# Patient Record
Sex: Female | Born: 1945 | Race: White | Hispanic: No | Smoking: Former smoker
Health system: Southern US, Community
[De-identification: ages and names within clinical notes are randomized; demographics above are authoritative.]

## PROBLEM LIST (undated history)

## (undated) DIAGNOSIS — M539 Dorsopathy, unspecified: Secondary | ICD-10-CM

## (undated) DIAGNOSIS — I719 Aortic aneurysm of unspecified site, without rupture: Secondary | ICD-10-CM

## (undated) DIAGNOSIS — D649 Anemia, unspecified: Secondary | ICD-10-CM

## (undated) DIAGNOSIS — G4730 Sleep apnea, unspecified: Secondary | ICD-10-CM

## (undated) DIAGNOSIS — M797 Fibromyalgia: Secondary | ICD-10-CM

## (undated) DIAGNOSIS — M069 Rheumatoid arthritis, unspecified: Secondary | ICD-10-CM

## (undated) HISTORY — PX: HIP SURGERY: SHX245

## (undated) HISTORY — PX: BACK SURGERY: SHX140

## (undated) HISTORY — PX: APPENDECTOMY: SHX54

## (undated) HISTORY — PX: JOINT REPLACEMENT: SHX530

## (undated) HISTORY — PX: ABDOMINAL HYSTERECTOMY: SHX81

## (undated) HISTORY — PX: ROTATOR CUFF REPAIR: SHX139

## (undated) HISTORY — PX: KNEE SURGERY: SHX244

---

## 1998-08-24 ENCOUNTER — Ambulatory Visit (HOSPITAL_COMMUNITY): Admission: RE | Admit: 1998-08-24 | Discharge: 1998-08-24 | Payer: Self-pay | Admitting: Orthopaedic Surgery

## 2002-04-08 ENCOUNTER — Encounter: Payer: Self-pay | Admitting: Internal Medicine

## 2002-04-08 ENCOUNTER — Encounter: Admission: RE | Admit: 2002-04-08 | Discharge: 2002-04-08 | Payer: Self-pay | Admitting: Internal Medicine

## 2002-11-16 ENCOUNTER — Encounter: Payer: Self-pay | Admitting: Specialist

## 2002-11-16 ENCOUNTER — Encounter: Admission: RE | Admit: 2002-11-16 | Discharge: 2002-11-16 | Payer: Self-pay | Admitting: Specialist

## 2002-11-21 ENCOUNTER — Encounter: Admission: RE | Admit: 2002-11-21 | Discharge: 2002-11-21 | Payer: Self-pay | Admitting: Specialist

## 2002-11-21 ENCOUNTER — Encounter: Payer: Self-pay | Admitting: Specialist

## 2016-04-23 ENCOUNTER — Other Ambulatory Visit: Payer: Self-pay | Admitting: Rheumatology

## 2016-04-23 ENCOUNTER — Ambulatory Visit
Admission: RE | Admit: 2016-04-23 | Discharge: 2016-04-23 | Disposition: A | Discharge status: Home / Self Care | Payer: Medicare Other | Source: Ambulatory Visit | Attending: Rheumatology | Admitting: Rheumatology

## 2016-04-23 DIAGNOSIS — M545 Low back pain: Secondary | ICD-10-CM

## 2016-04-23 DIAGNOSIS — M549 Dorsalgia, unspecified: Secondary | ICD-10-CM

## 2016-04-25 ENCOUNTER — Other Ambulatory Visit: Payer: Self-pay | Admitting: Rheumatology

## 2016-04-25 DIAGNOSIS — M546 Pain in thoracic spine: Secondary | ICD-10-CM

## 2016-04-26 ENCOUNTER — Ambulatory Visit
Admission: RE | Admit: 2016-04-26 | Discharge: 2016-04-26 | Disposition: A | Discharge status: Home / Self Care | Payer: Medicare Other | Source: Ambulatory Visit | Attending: Rheumatology | Admitting: Rheumatology

## 2016-04-26 DIAGNOSIS — M546 Pain in thoracic spine: Secondary | ICD-10-CM

## 2016-05-06 ENCOUNTER — Other Ambulatory Visit (HOSPITAL_COMMUNITY): Payer: Self-pay | Admitting: Interventional Radiology

## 2016-05-06 DIAGNOSIS — IMO0001 Reserved for inherently not codable concepts without codable children: Secondary | ICD-10-CM

## 2016-05-06 DIAGNOSIS — M4850XS Collapsed vertebra, not elsewhere classified, site unspecified, sequela of fracture: Principal | ICD-10-CM

## 2016-05-15 ENCOUNTER — Ambulatory Visit (HOSPITAL_COMMUNITY)
Admission: RE | Admit: 2016-05-15 | Discharge: 2016-05-15 | Disposition: A | Discharge status: Home / Self Care | Payer: Medicare Other | Source: Ambulatory Visit | Attending: Interventional Radiology | Admitting: Interventional Radiology

## 2016-05-15 DIAGNOSIS — IMO0001 Reserved for inherently not codable concepts without codable children: Secondary | ICD-10-CM

## 2016-05-15 DIAGNOSIS — M4850XS Collapsed vertebra, not elsewhere classified, site unspecified, sequela of fracture: Principal | ICD-10-CM

## 2016-05-15 HISTORY — PX: IR GENERIC HISTORICAL: IMG1180011

## 2016-05-16 ENCOUNTER — Encounter (HOSPITAL_COMMUNITY): Payer: Self-pay | Admitting: Interventional Radiology

## 2016-05-20 ENCOUNTER — Telehealth (HOSPITAL_COMMUNITY): Payer: Self-pay

## 2016-05-20 NOTE — Telephone Encounter (Signed)
Called to schedule kp/vp, left message for pt to return call. AW 

## 2016-05-22 ENCOUNTER — Other Ambulatory Visit (HOSPITAL_COMMUNITY): Payer: Self-pay | Admitting: Interventional Radiology

## 2016-05-22 ENCOUNTER — Telehealth (HOSPITAL_COMMUNITY): Payer: Self-pay

## 2016-05-22 DIAGNOSIS — IMO0001 Reserved for inherently not codable concepts without codable children: Secondary | ICD-10-CM

## 2016-05-22 DIAGNOSIS — M4850XS Collapsed vertebra, not elsewhere classified, site unspecified, sequela of fracture: Principal | ICD-10-CM

## 2016-05-22 NOTE — Telephone Encounter (Signed)
Called to schedule procedure, left message for pt to return call. AW 

## 2016-06-03 ENCOUNTER — Other Ambulatory Visit: Payer: Self-pay | Admitting: Radiology

## 2016-06-04 ENCOUNTER — Other Ambulatory Visit: Payer: Self-pay | Admitting: General Surgery

## 2016-06-05 ENCOUNTER — Telehealth (HOSPITAL_COMMUNITY): Payer: Self-pay

## 2016-06-05 ENCOUNTER — Ambulatory Visit (HOSPITAL_COMMUNITY)
Admission: RE | Admit: 2016-06-05 | Discharge: 2016-06-05 | Disposition: A | Discharge status: Home / Self Care | Payer: Medicare Other | Source: Ambulatory Visit | Attending: Interventional Radiology | Admitting: Interventional Radiology

## 2016-06-05 ENCOUNTER — Other Ambulatory Visit (HOSPITAL_COMMUNITY): Payer: Self-pay | Admitting: Interventional Radiology

## 2016-06-05 ENCOUNTER — Encounter (HOSPITAL_COMMUNITY): Payer: Self-pay

## 2016-06-05 DIAGNOSIS — I719 Aortic aneurysm of unspecified site, without rupture: Secondary | ICD-10-CM | POA: Diagnosis not present

## 2016-06-05 DIAGNOSIS — M797 Fibromyalgia: Secondary | ICD-10-CM | POA: Insufficient documentation

## 2016-06-05 DIAGNOSIS — M069 Rheumatoid arthritis, unspecified: Secondary | ICD-10-CM | POA: Diagnosis not present

## 2016-06-05 DIAGNOSIS — M4850XS Collapsed vertebra, not elsewhere classified, site unspecified, sequela of fracture: Secondary | ICD-10-CM

## 2016-06-05 DIAGNOSIS — M810 Age-related osteoporosis without current pathological fracture: Secondary | ICD-10-CM | POA: Insufficient documentation

## 2016-06-05 DIAGNOSIS — M4854XA Collapsed vertebra, not elsewhere classified, thoracic region, initial encounter for fracture: Secondary | ICD-10-CM | POA: Insufficient documentation

## 2016-06-05 DIAGNOSIS — N644 Mastodynia: Secondary | ICD-10-CM | POA: Diagnosis not present

## 2016-06-05 DIAGNOSIS — G4730 Sleep apnea, unspecified: Secondary | ICD-10-CM | POA: Diagnosis not present

## 2016-06-05 DIAGNOSIS — D649 Anemia, unspecified: Secondary | ICD-10-CM | POA: Diagnosis not present

## 2016-06-05 DIAGNOSIS — IMO0001 Reserved for inherently not codable concepts without codable children: Secondary | ICD-10-CM

## 2016-06-05 DIAGNOSIS — R52 Pain, unspecified: Secondary | ICD-10-CM

## 2016-06-05 HISTORY — DX: Aortic aneurysm of unspecified site, without rupture: I71.9

## 2016-06-05 HISTORY — DX: Rheumatoid arthritis, unspecified: M06.9

## 2016-06-05 HISTORY — DX: Anemia, unspecified: D64.9

## 2016-06-05 HISTORY — DX: Fibromyalgia: M79.7

## 2016-06-05 HISTORY — DX: Dorsopathy, unspecified: M53.9

## 2016-06-05 HISTORY — DX: Sleep apnea, unspecified: G47.30

## 2016-06-05 HISTORY — PX: IR GENERIC HISTORICAL: IMG1180011

## 2016-06-05 LAB — PROTIME-INR
INR: 1.02
PROTHROMBIN TIME: 13.4 s (ref 11.4–15.2)

## 2016-06-05 LAB — BASIC METABOLIC PANEL
ANION GAP: 10 (ref 5–15)
BUN: 11 mg/dL (ref 6–20)
CALCIUM: 9.2 mg/dL (ref 8.9–10.3)
CO2: 28 mmol/L (ref 22–32)
Chloride: 100 mmol/L — ABNORMAL LOW (ref 101–111)
Creatinine, Ser: 0.71 mg/dL (ref 0.44–1.00)
GFR calc Af Amer: >60 mL/min (ref >60)
GFR calc non Af Amer: >60 mL/min (ref >60)
Glucose, Bld: 170 mg/dL — ABNORMAL HIGH (ref 65–99)
POTASSIUM: 3.8 mmol/L (ref 3.5–5.1)
SODIUM: 138 mmol/L (ref 135–145)

## 2016-06-05 LAB — CBC
HCT: 42.7 % (ref 36.0–46.0)
HEMOGLOBIN: 13.2 g/dL (ref 12.0–15.0)
MCH: 28.8 pg (ref 26.0–34.0)
MCHC: 30.9 g/dL (ref 30.0–36.0)
MCV: 93.2 fL (ref 78.0–100.0)
Platelets: 236 10*3/uL (ref 150–400)
RBC: 4.58 MIL/uL (ref 3.87–5.11)
RDW: 15.8 % — ABNORMAL HIGH (ref 11.5–15.5)
WBC: 11 10*3/uL — ABNORMAL HIGH (ref 4.0–10.5)

## 2016-06-05 LAB — APTT: APTT: 27 s (ref 24–36)

## 2016-06-05 MED ORDER — MIDAZOLAM HCL 2 MG/2ML IJ SOLN
INTRAMUSCULAR | Status: AC
  Filled 2016-06-05: qty 2

## 2016-06-05 MED ORDER — HYDROMORPHONE HCL 1 MG/ML IJ SOLN
INTRAMUSCULAR | Status: AC
  Filled 2016-06-05: qty 1

## 2016-06-05 MED ORDER — IOPAMIDOL (ISOVUE-300) INJECTION 61%
INTRAVENOUS | Status: AC
  Administered 2016-06-05: 3 mL
  Filled 2016-06-05: qty 50

## 2016-06-05 MED ORDER — BUPIVACAINE HCL (PF) 0.25 % IJ SOLN
INTRAMUSCULAR | Status: AC
  Filled 2016-06-05: qty 30

## 2016-06-05 MED ORDER — SODIUM CHLORIDE 0.9 % IV SOLN: INTRAVENOUS | Status: DC

## 2016-06-05 MED ORDER — SODIUM CHLORIDE 0.9 % IV SOLN: INTRAVENOUS | Status: AC

## 2016-06-05 MED ORDER — FENTANYL CITRATE (PF) 100 MCG/2ML IJ SOLN
INTRAMUSCULAR | Status: AC | PRN
  Administered 2016-06-05: 25 ug via INTRAVENOUS
  Administered 2016-06-05 (×2): 50 ug via INTRAVENOUS

## 2016-06-05 MED ORDER — CEFAZOLIN SODIUM-DEXTROSE 2-4 GM/100ML-% IV SOLN
2.0000 g | INTRAVENOUS | Status: AC
  Administered 2016-06-05: 2 g via INTRAVENOUS

## 2016-06-05 MED ORDER — TOBRAMYCIN SULFATE 1.2 G IJ SOLR
INTRAMUSCULAR | Status: AC
  Filled 2016-06-05: qty 1.2

## 2016-06-05 MED ORDER — CEFAZOLIN SODIUM-DEXTROSE 2-4 GM/100ML-% IV SOLN
INTRAVENOUS | Status: AC
  Filled 2016-06-05: qty 100

## 2016-06-05 MED ORDER — FENTANYL CITRATE (PF) 100 MCG/2ML IJ SOLN
INTRAMUSCULAR | Status: AC
  Filled 2016-06-05: qty 2

## 2016-06-05 MED ORDER — TOBRAMYCIN SULFATE 1.2 G IJ SOLR
INTRAMUSCULAR | Status: AC | PRN
  Administered 2016-06-05: .01 g via TOPICAL

## 2016-06-05 MED ORDER — MIDAZOLAM HCL 2 MG/2ML IJ SOLN
INTRAMUSCULAR | Status: AC | PRN
  Administered 2016-06-05 (×2): 1 mg via INTRAVENOUS

## 2016-06-05 MED ORDER — HYDROMORPHONE HCL 1 MG/ML IJ SOLN
INTRAMUSCULAR | Status: AC | PRN
  Administered 2016-06-05: 1 mg via INTRAVENOUS

## 2016-06-05 MED ORDER — BUPIVACAINE HCL (PF) 0.25 % IJ SOLN
INTRAMUSCULAR | Status: AC | PRN
  Administered 2016-06-05: 20 mL

## 2016-06-05 NOTE — Sedation Documentation (Signed)
Patient is resting comfortably. 

## 2016-06-05 NOTE — Sedation Documentation (Signed)
positional discomfort continues

## 2016-06-05 NOTE — Sedation Documentation (Signed)
Pt very uncomfortable on procedure table in prone position.

## 2016-06-05 NOTE — H&P (Signed)
Referring Physician(s): Truslow,W  Supervising Physician: Julieanne Cotton  Patient Status:   [facility] OP  Chief Complaint:  Back pain,T6 fracture  Subjective: Patient familiar to [facility] service from recent consultation on 05/15/16 regarding treatment options for new T6 compression fracture. She was deemed an appropriate candidate for T6 vertebroplasty/kyphoplasty and presents today for the above procedure. See previous note for additional details of hx. She has had prior T11 vertebral augmentation in [place] approximately 3 years ago. She currently denies fever, headache, vomiting or abnormal bleeding. She does have some dyspnea with exertion, intermittent nausea and pain with circumferential radiation of pain in the upper chest around her ribs. Past Medical History:  Diagnosis Date  . Anemia   . Aortic aneurysm (HCC)   . Back problem   . Fibromyalgia   . RA (rheumatoid arthritis) (HCC)   . Sleep apnea    Past Surgical History:  Procedure Laterality Date  . ABDOMINAL HYSTERECTOMY    . APPENDECTOMY    . BACK SURGERY    . HIP SURGERY    . IR GENERIC HISTORICAL  05/15/2016   IR RADIOLOGIST EVAL & MGMT 05/15/2016 [facility]-INTERV RAD  . JOINT REPLACEMENT    . KNEE SURGERY    . ROTATOR CUFF REPAIR        Allergies: Darvon [propoxyphene]; Latex; Morphine; and Other  Medications: Prior to Admission medications   Medication Sig Start Date End Date Taking? Authorizing Provider  allopurinol (ZYLOPRIM) 100 MG tablet Take 200 mg by mouth daily.   Yes Historical Provider, MD  calcium carbonate (OSCAL) 1500 (600 Ca) MG TABS tablet Take 1,800 mg of elemental calcium by mouth daily.    Yes Historical Provider, MD  diphenhydrAMINE (BENADRYL) 25 MG tablet Take 25 mg by mouth at bedtime.   Yes Historical Provider, MD  leflunomide (ARAVA) 20 MG tablet Take 20 mg by mouth daily. 08/25/14  Yes Historical Provider, MD  omeprazole (PRILOSEC) 20 MG capsule Take 20 mg by mouth daily. 09/16/02  Yes  Historical Provider, MD  oxyCODONE (OXYCONTIN) 20 mg 12 hr tablet Take 20 mg by mouth every 4 (four) hours as needed (pain).   Yes Historical Provider, MD  predniSONE (DELTASONE) 10 MG tablet Take 10 mg by mouth daily.   Yes Historical Provider, MD  Tocilizumab 162 MG/0.9ML SOSY Inject 0.9 mLs into the skin every 14 (fourteen) days.    Yes Historical Provider, MD     Vital Signs: BP 138/76   Pulse 83   Temp 97.8 F (36.6 C) (Oral)   Resp 18   Ht 5\' 5"  (1.651 m)   Wt 214 lb (97.1 kg)   SpO2 92%   BMI 35.61 kg/m   Physical Exam awake, alert. Chest with few right basilar crackles, left clear; heart with regular rate and rhythm, abdomen soft, obese, positive bowel sounds, nontender. Mild to moderate paravertebral tenderness at T6 region. Lower extremities with trace edema, changes of RA  in digits.  Imaging: No results found.  Labs:  CBC:  Recent Labs  06/05/16 0733  WBC 11.0*  HGB 13.2  HCT 42.7  PLT 236    COAGS: No results for input(s): INR, APTT in the last 8760 hours.  BMP: No results for input(s): NA, K, CL, CO2, GLUCOSE, BUN, CALCIUM, CREATININE, GFRNONAA, GFRAA in the last 8760 hours.  Invalid input(s): CMP  LIVER FUNCTION TESTS: No results for input(s): BILITOT, AST, ALT, ALKPHOS, PROT, ALBUMIN in the last 8760 hours.  Assessment and Plan: Patient with history  of osteoporosis, prior T11 vertebral augmentation 3 years ago in [facility]; now with subacute T6 fracture; seen recently in consultation by Dr. Corliss Skains and deemed an appropriate candidate for T6 VP/KP.  She presents today for the procedure. Risks and benefits discussed with the patient/daughter including, but not limited to education regarding the natural healing process of compression fractures without intervention, bleeding, infection, cement migration which may cause spinal cord damage, paralysis, pulmonary embolism or even death.All of the patient's questions were answered, patient is agreeable to  proceed.Consent signed and in chart.      Electronically Signed: D. Jeananne Rama 06/05/2016, 7:51 AM   I spent a total of 25 minutes at the the patient's bedside AND on the patient's hospital floor or unit, greater than 50% of which was counseling/coordinating care for T6 vertebroplasty/kyphoplasty

## 2016-06-05 NOTE — Procedures (Signed)
S/P T6 VP 

## 2016-06-05 NOTE — Telephone Encounter (Signed)
Called to schedule 2 wk f/u, left message for pt to return call. AW 

## 2016-06-05 NOTE — Progress Notes (Signed)
Pt. Here today to have a KP procedure with Dr. Corliss Skains. Pt. Elected to transfer self, with assistance, to procedure table. Upon positioning prone, pt. Complained of Left breast discomfort despite breast repositioning. Pt. Given pain medication during procedure. Primary complaint remained the left breast. Upon completion of  Procedure, pt. Was log rolled, by staff, back to a stretcher where she was physically examined in the Left axillary rib area for any obvious signs of busing, tenderness, or broken rib (ribs). No visual evidence of injury found. MD ordered STAT rib series chest xray before transferring to post op care area and spoke with pt. About possible fracture due to her osteopetrosis, and discussed possible treatment if a fracture had occurred. Xray series completed. Family at bedside. Pt. Transferred to short stay, detailed report given to receiving RN. Pt. Continues to guard left breast area. Pt. Requesting a drink and food.

## 2016-06-05 NOTE — Discharge Instructions (Signed)
1.No stooping ,bending or lifting more than 10 lbs for 2 weeks. °2.Use walker to ambulate  for 2 weeks. °3.RTC PRN in 2 weeks ° °KYPHOPLASTY/VERTEBROPLASTY DISCHARGE INSTRUCTIONS ° °Medications: (check all that apply) ° °   Resume all home medications as before procedure. °                 °  °Continue your pain medications as prescribed as needed.  Over the next 3-5 days, decrease your pain medication as tolerated.  Over the counter medications (i.e. Tylenol, ibuprofen, and aleve) may be substituted once severe/moderate pain symptoms have subsided. ° ° Wound Care: °- Bandages may be removed the day following your procedure.  You may get your incision wet once bandages are removed.  Bandaids may be used to cover the incisions until scab formation.  Topical ointments are optional. ° °- If you develop a fever greater than 101 degrees, have increased skin redness at the incision sites or pus-like oozing from incisions occurring within 1 week of the procedure, contact radiology at [phone] or [phone]. ° °- Ice pack to back for 15-20 minutes 2-3 time per day for first 2-3 days post procedure.  The ice will expedite muscle healing and help with the pain from the incisions. ° ° Activity: °- Bedrest today with limited activity for 24 hours post procedure. ° °- No driving for 48 hours. ° °- Increase your activity as tolerated after bedrest (with assistance if necessary). ° °- Refrain from any strenuous activity or heavy lifting (greater than 10 lbs.). ° ° Follow up: °- Contact radiology at [phone] or [phone] if any questions/concerns. ° °- A physician assistant from radiology will contact you in approximately 1 week. ° °- If a biopsy was performed at the time of your procedure, your referring physician should receive the results in usually 2-3 days. ° ° ° ° ° ° ° ° °

## 2016-06-06 ENCOUNTER — Encounter (HOSPITAL_COMMUNITY): Payer: Self-pay | Admitting: Interventional Radiology

## 2016-06-11 ENCOUNTER — Other Ambulatory Visit (HOSPITAL_COMMUNITY): Payer: Self-pay | Admitting: Interventional Radiology

## 2016-06-11 DIAGNOSIS — M4850XA Collapsed vertebra, not elsewhere classified, site unspecified, initial encounter for fracture: Principal | ICD-10-CM

## 2016-06-11 DIAGNOSIS — IMO0001 Reserved for inherently not codable concepts without codable children: Secondary | ICD-10-CM

## 2016-07-03 ENCOUNTER — Ambulatory Visit (HOSPITAL_COMMUNITY): Payer: Medicare Other

## 2016-07-10 ENCOUNTER — Ambulatory Visit (HOSPITAL_COMMUNITY): Payer: Medicare Other

## 2016-07-10 ENCOUNTER — Telehealth (HOSPITAL_COMMUNITY): Payer: Self-pay

## 2016-07-10 NOTE — Telephone Encounter (Signed)
Called to reschedule follow up, left message for pt to return call. AW

## 2018-05-13 IMAGING — CR DG LUMBAR SPINE COMPLETE 4+V
5 series · 5 of 5 positions shown · non-contrast
Comparison: Lumbar spine radiographs 11/10/2013

CLINICAL DATA: Low back pain and thoracic pain for several weeks.
Personal history of compression fractures. Pain with deep breaths.

EXAM:
LUMBAR SPINE - COMPLETE 4+ VIEW

[w lumbar spine ap]
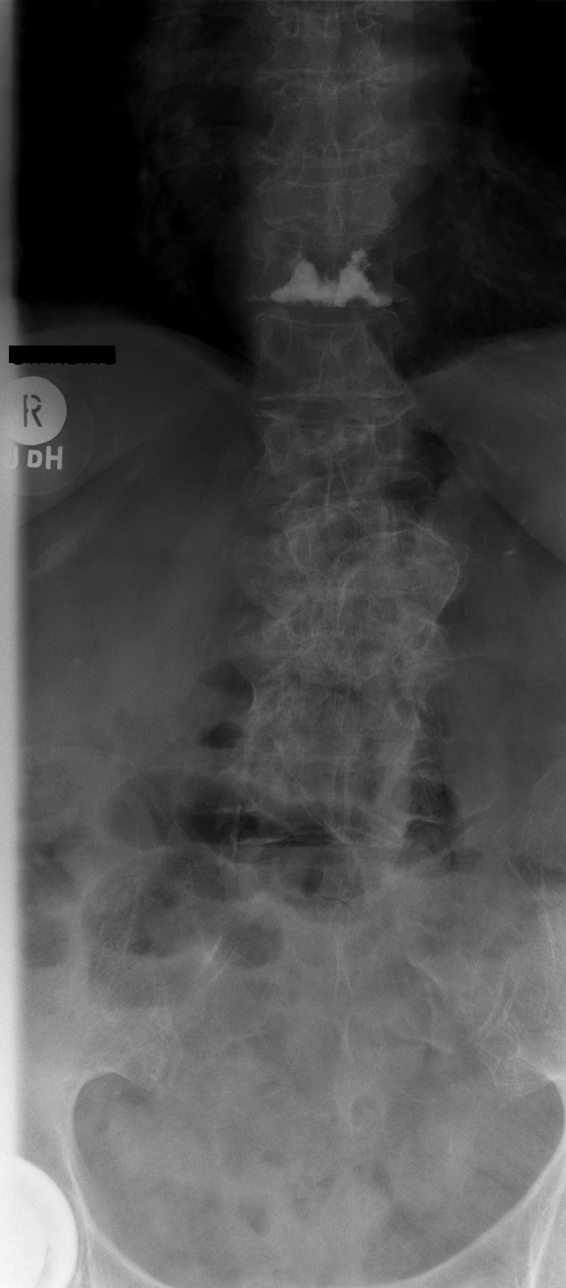

[w lumbar spine obl (1 of 2)]
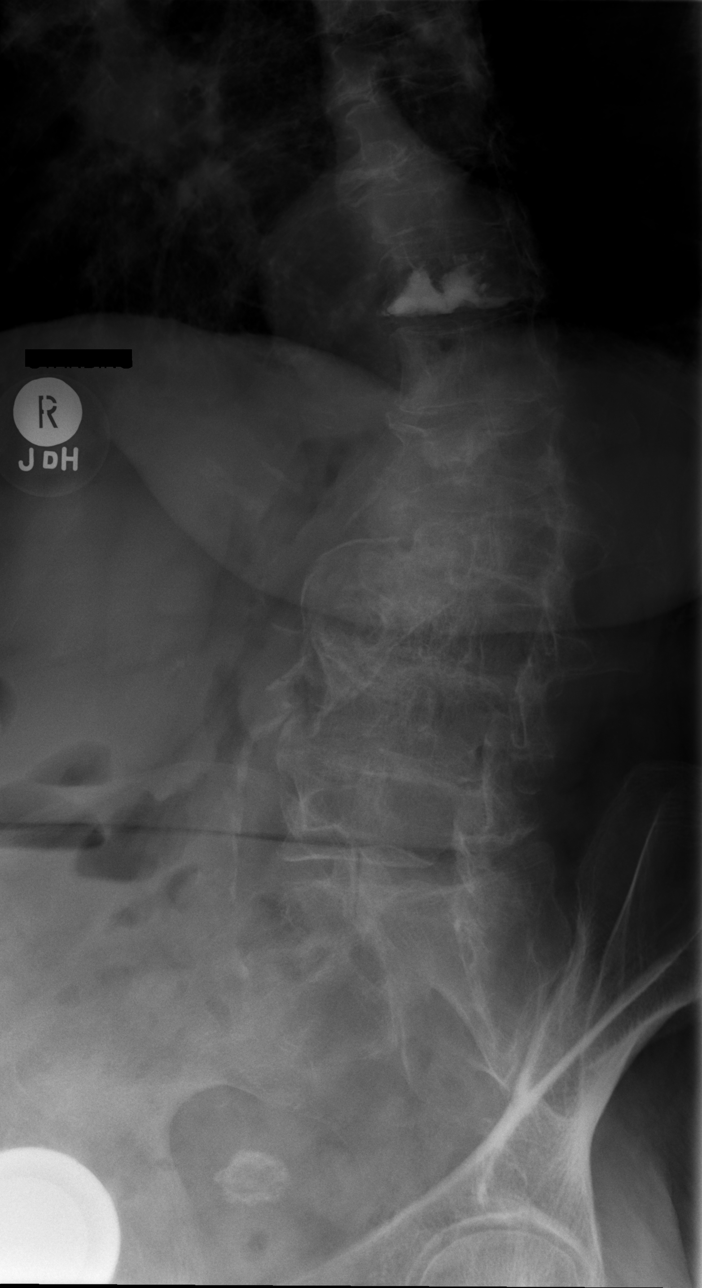

[w lumbar spine obl (2 of 2)]
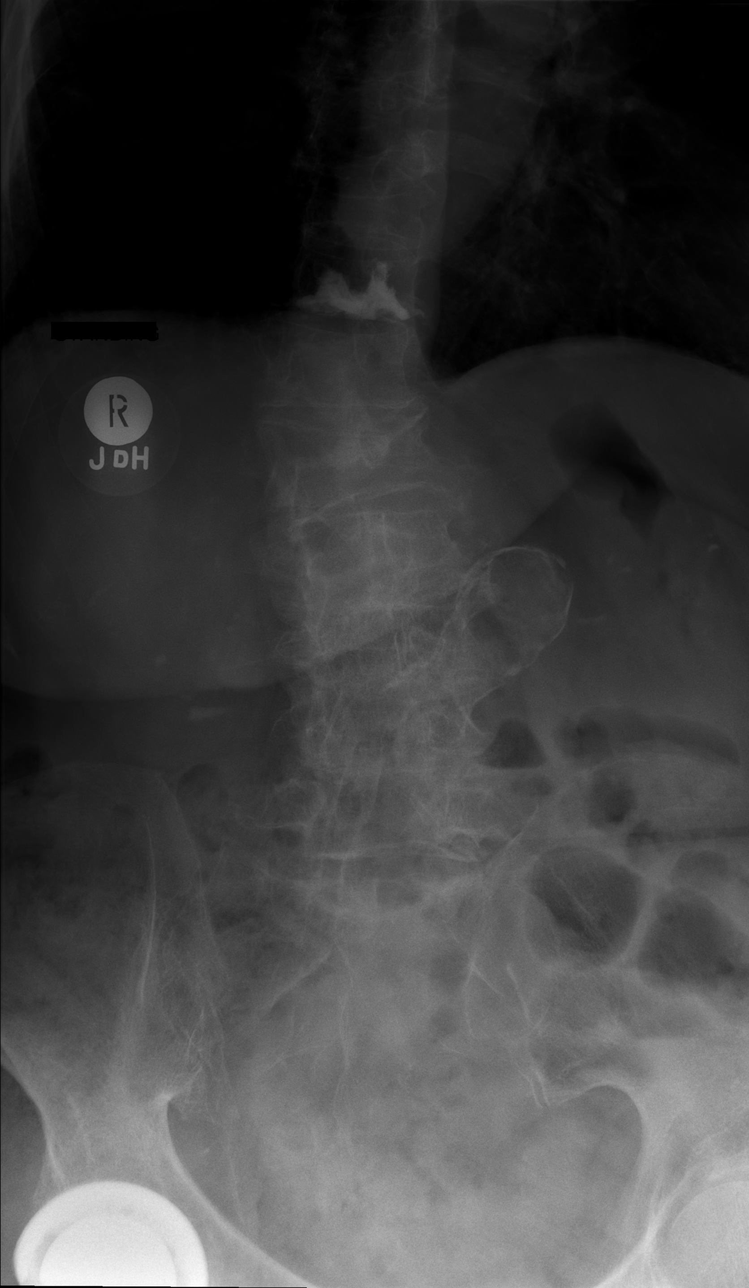

[w lumbar spine lat]
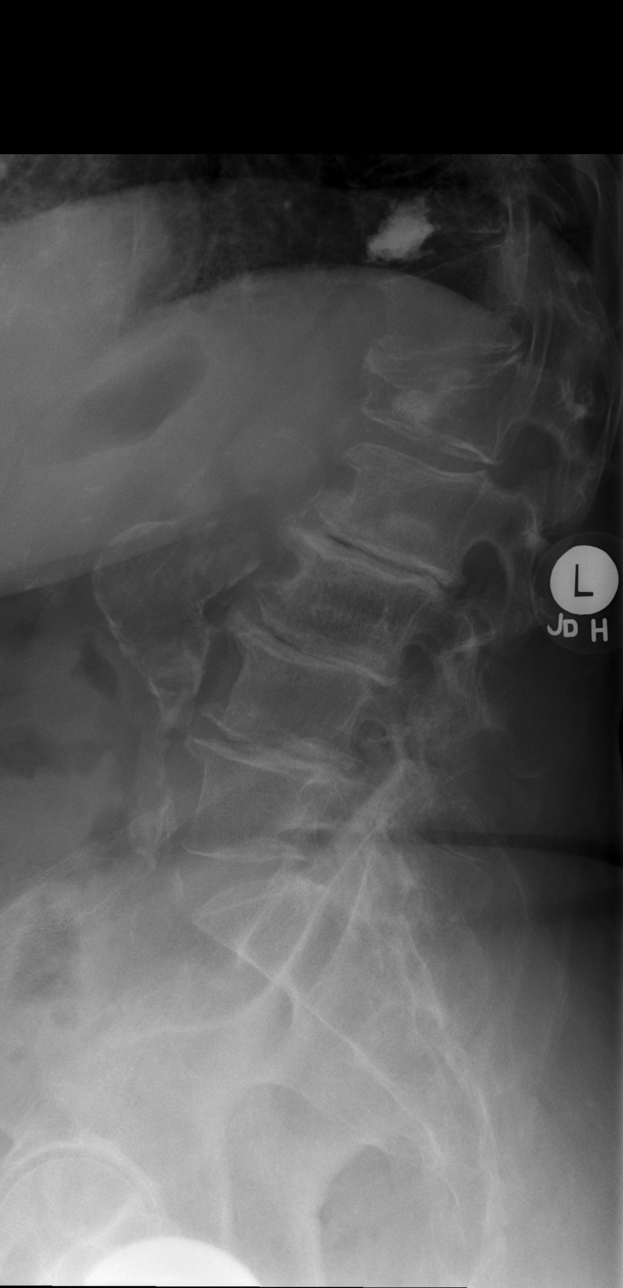

[w lumbar l-5 s-1 spot]
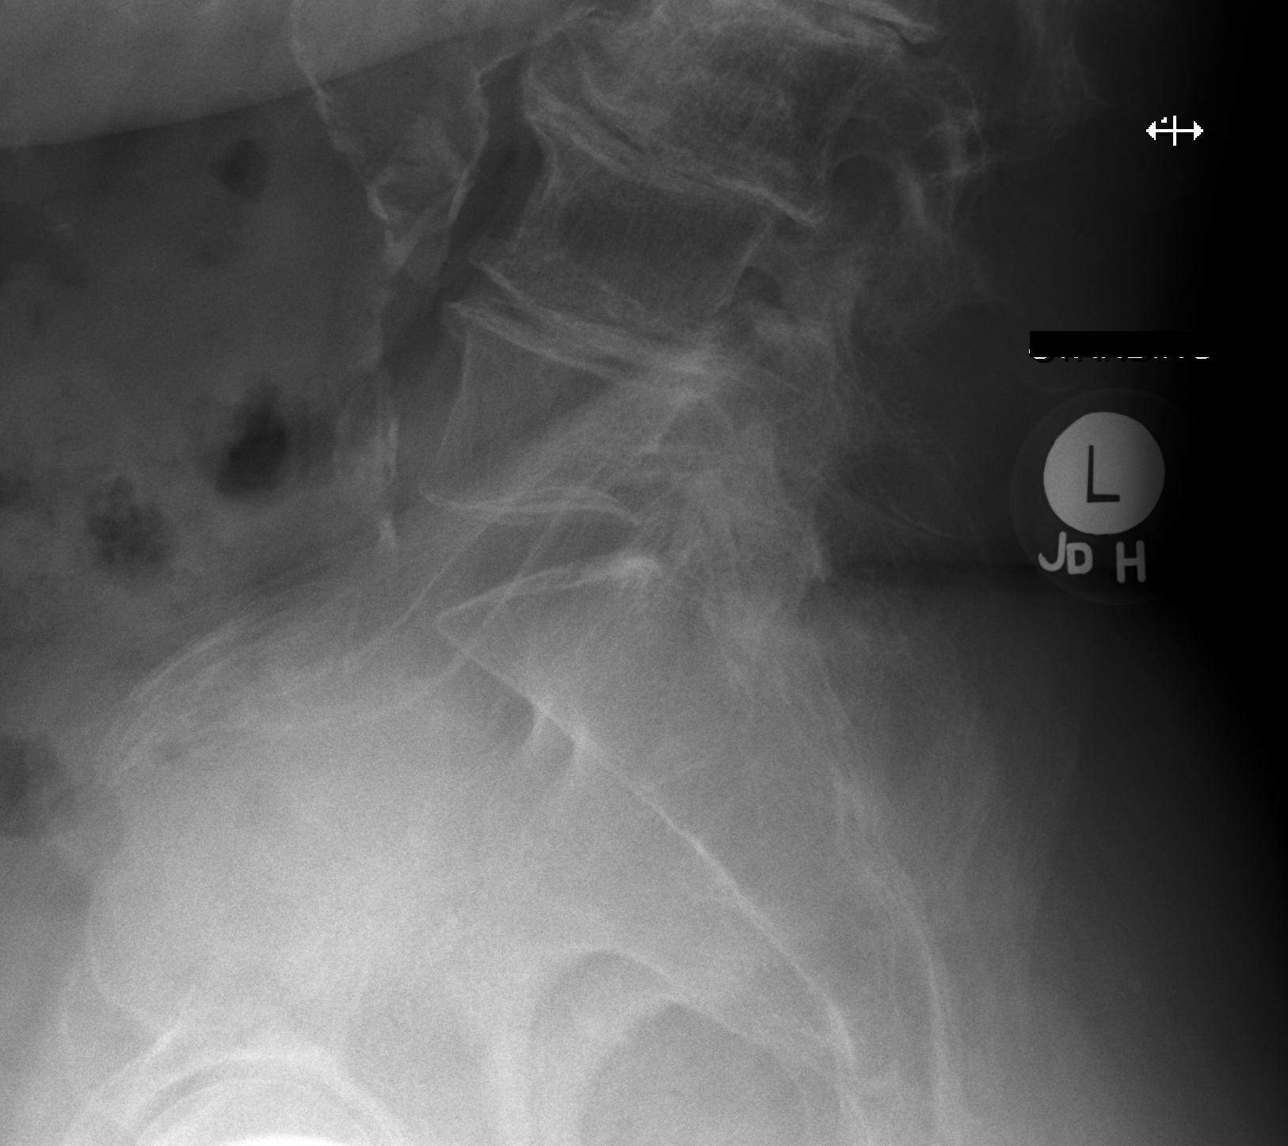

[5 of 5 positions shown; findings below may reference images not displayed]

FINDINGS: Five non rib-bearing lumbar type vertebral bodies are again seen. A
remote superior endplate fracture at L1 is stable. A remote superior
endplate fracture at T12 is stable. Spinal augmentation at T11 is
stable. Slight depression in the superior endplate of L2 is stable.
Vertebral body heights at L3, L4, and L5 are stable. Multilevel
endplate degenerative changes are stable. Slight retrolisthesis at
L2-3, L3-4, and L4-5 is stable. Aneurysmal dilation of the
infrarenal abdominal aorta is similar the prior exam. This has been
followed by [facility] recently.
IMPRESSION: 1. No acute abnormality of the lumbar spine.
2. Stable remote superior endplate compression fractures at T12, L2,
and most notably at L1.
3. Stable remote spinal augmentation at T11.

## 2018-05-13 IMAGING — CR DG THORACIC SPINE 3V
3 series · 3 of 3 positions shown · non-contrast
Comparison: Thoracic spine radiographs 12/19/2013. Two-view chest
x-ray 03/27/2016.

CLINICAL DATA: Low back and thoracic spine pain for several weeks.
Personal history of compression fractures.

EXAM:
THORACIC SPINE - 3 VIEWS

[w thoracic spine lat]
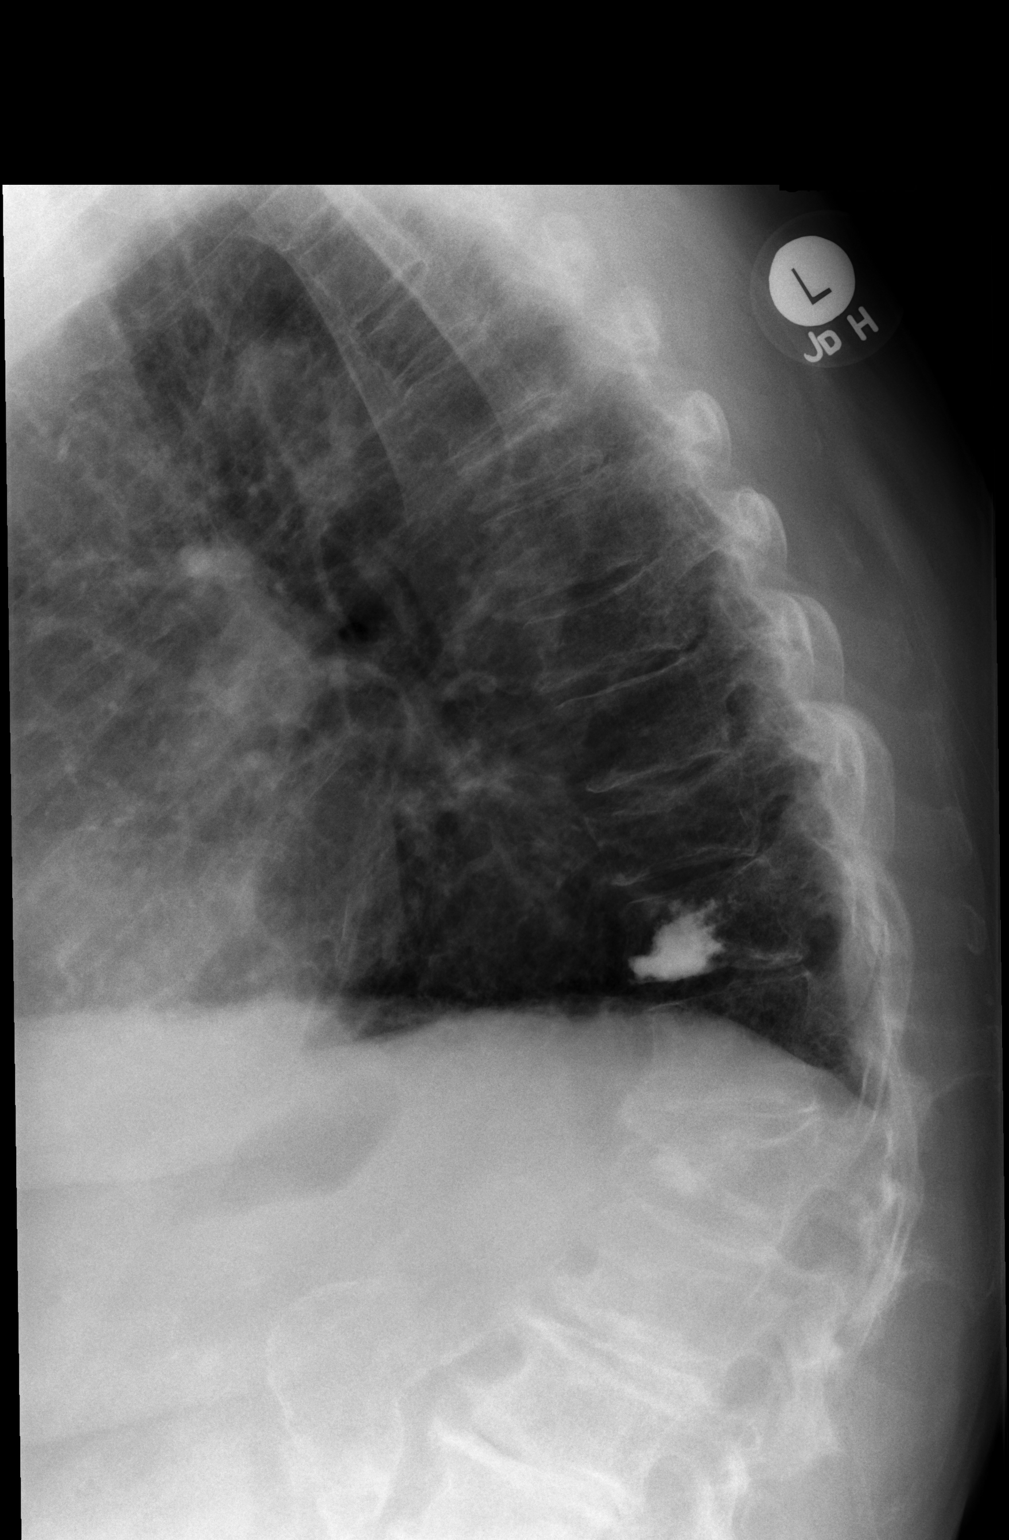

[w thoracic swimmers]
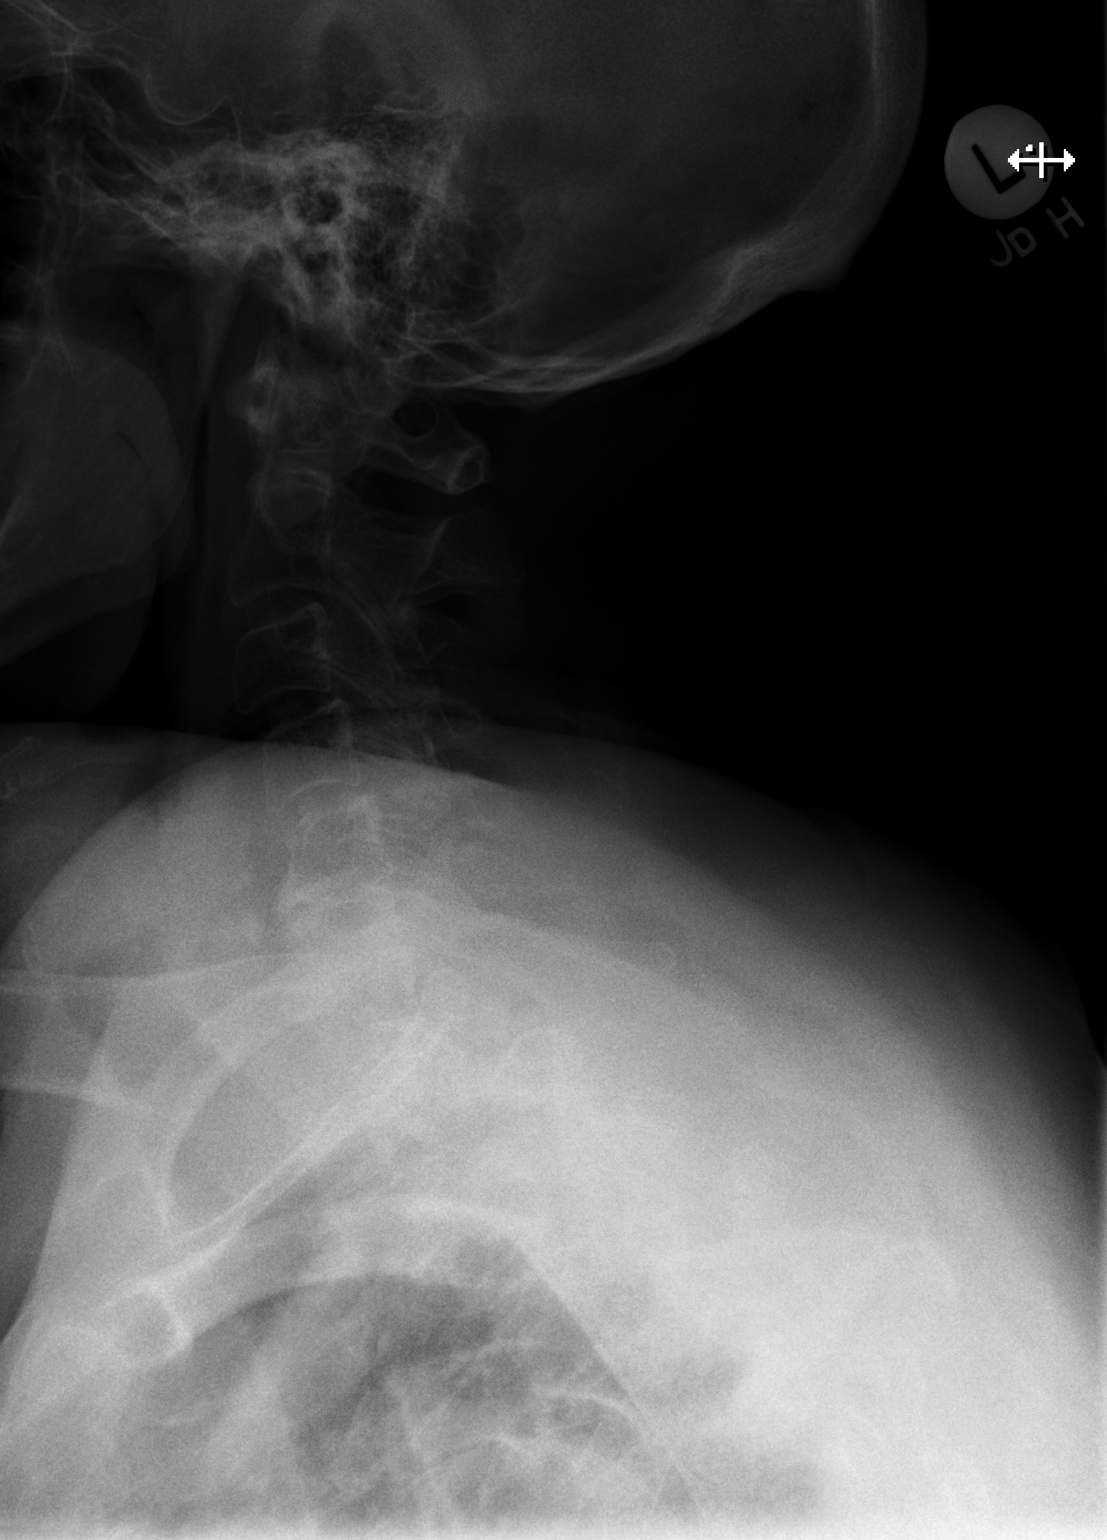

[w thoracic spine ap]
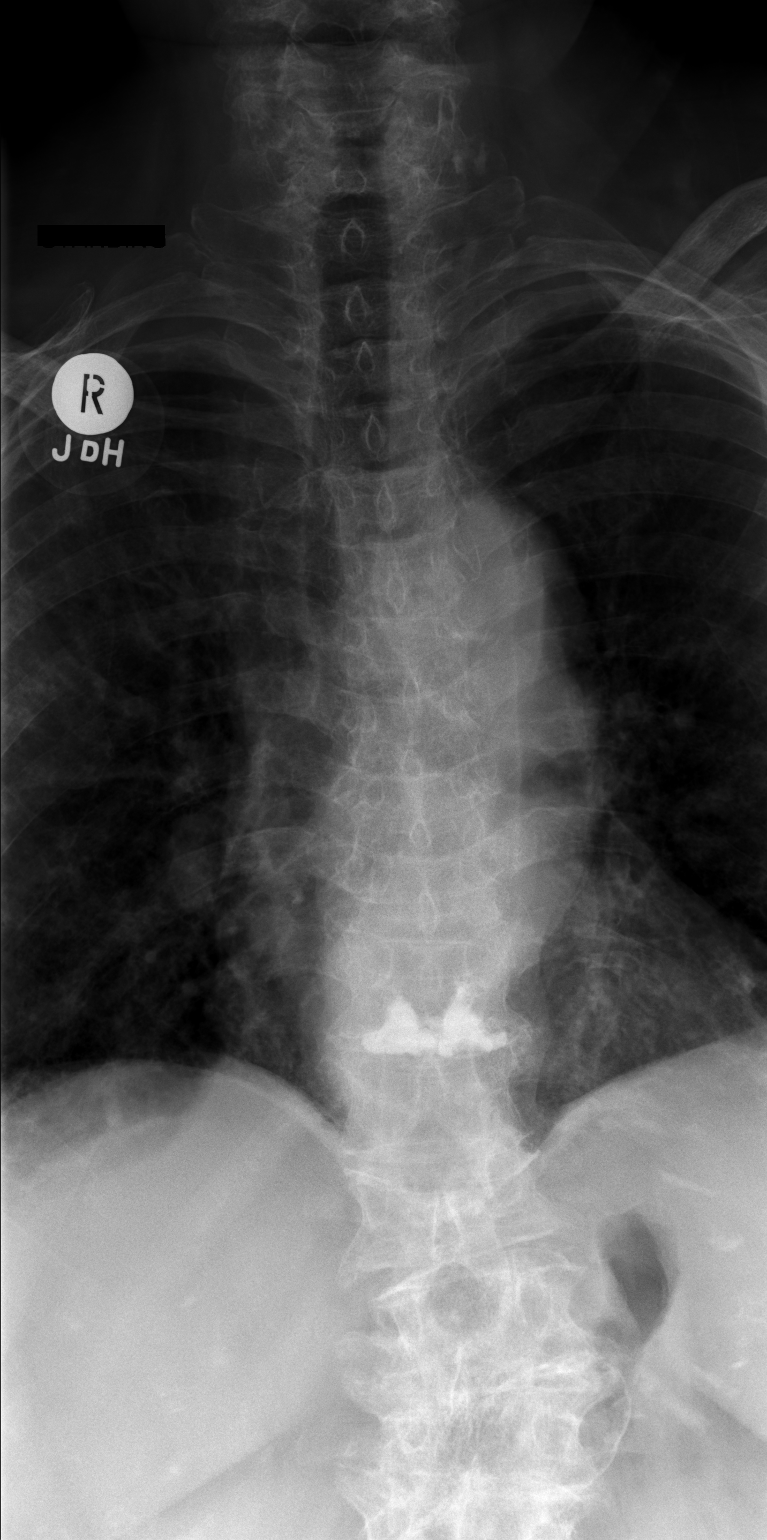

[3 of 3 positions shown; findings below may reference images not displayed]

FINDINGS: Remote spinal augmentation at T11 is stable. Remote superior
endplate fracture at T10 and inferior endplate fracture at T8 are
stable. There is a new superior endplate fracture at T6. This has
progressed since the chest x-ray of 03/27/2016. A remote T4 fracture
is stable. No other new fractures are present. Aneurysmal dilation
of the abdominal aorta is again noted.
IMPRESSION: 1. Progressive acute/subacute superior endplate T6 compression
fracture with approximately 50% loss of height compared to the
adjacent levels.
2. Remote fractures at T4, T8, T10, and T11 are stable.
3. Aneurysmal dilation of the abdominal aorta.

## 2019-08-15 DEATH — deceased
